# Patient Record
Sex: Male | Born: 1990 | Race: White | Hispanic: No | Marital: Single | Smoking: Never smoker
Health system: Southern US, Community
[De-identification: ages and names within clinical notes are randomized; demographics above are authoritative.]

## PROBLEM LIST (undated history)

## (undated) DIAGNOSIS — D369 Benign neoplasm, unspecified site: Secondary | ICD-10-CM

## (undated) HISTORY — PX: TUMOR REMOVAL: SHX12

## (undated) HISTORY — DX: Benign neoplasm, unspecified site: D36.9

---
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
## 2011-12-27 ENCOUNTER — Inpatient Hospital Stay: Payer: Self-pay | Admitting: Internal Medicine

## 2011-12-27 LAB — CBC
MCH: 30.8 pg (ref 26.0–34.0)
MCHC: 34.8 g/dL (ref 32.0–36.0)
MCV: 89 fL (ref 80–100)
Platelet: 127 10*3/uL — ABNORMAL LOW (ref 150–440)
RDW: 13.9 % (ref 11.5–14.5)

## 2011-12-27 LAB — TROPONIN I
Troponin-I: <0.02 ng/mL
Troponin-I: 0.02 ng/mL

## 2011-12-27 LAB — URINALYSIS, COMPLETE
Bilirubin,UR: NEGATIVE
Glucose,UR: NEGATIVE mg/dL (ref 0–75)
Leukocyte Esterase: NEGATIVE
Ph: 6 (ref 4.5–8.0)
RBC,UR: 11 /HPF (ref 0–5)
Specific Gravity: 1.027 (ref 1.003–1.030)
Squamous Epithelial: 4
WBC UR: 7 /HPF (ref 0–5)

## 2011-12-27 LAB — DIFFERENTIAL
Bands: 7 %
Basophil #: 0 10*3/uL (ref 0.0–0.1)
Basophil %: 0.9 %
Lymphocyte #: 0.5 10*3/uL — ABNORMAL LOW (ref 1.0–3.6)
Lymphocyte %: 16.5 %
Lymphocytes: 11 %
Monocyte %: 4 %
Monocytes: 3 %
Neutrophil #: 2.4 10*3/uL (ref 1.4–6.5)
Variant Lymphocyte - H1-Rlymph: 5 %

## 2011-12-27 LAB — LIPID PANEL
HDL Cholesterol: 26 mg/dL — ABNORMAL LOW (ref 40–60)
Ldl Cholesterol, Calc: 23 mg/dL (ref 0–100)
Triglycerides: 158 mg/dL (ref 0–200)

## 2011-12-27 LAB — COMPREHENSIVE METABOLIC PANEL
Albumin: 3.4 g/dL (ref 3.4–5.0)
Alkaline Phosphatase: 69 U/L (ref 50–136)
BUN: 13 mg/dL (ref 7–18)
Bilirubin,Total: 0.6 mg/dL (ref 0.2–1.0)
Calcium, Total: 8.4 mg/dL — ABNORMAL LOW (ref 8.5–10.1)
Glucose: 103 mg/dL — ABNORMAL HIGH (ref 65–99)
Potassium: 4.1 mmol/L (ref 3.5–5.1)
SGOT(AST): 284 U/L — ABNORMAL HIGH (ref 15–37)
SGPT (ALT): 202 U/L — ABNORMAL HIGH (ref 12–78)
Sodium: 135 mmol/L — ABNORMAL LOW (ref 136–145)
Total Protein: 8 g/dL (ref 6.4–8.2)

## 2011-12-27 LAB — RAPID INFLUENZA A&B ANTIGENS

## 2011-12-27 LAB — RAPID HIV-1/2 QL/CONFIRM: HIV-1/2,Rapid Ql: NEGATIVE

## 2011-12-27 LAB — LIPASE, BLOOD: Lipase: 140 U/L (ref 73–393)

## 2011-12-28 LAB — COMPREHENSIVE METABOLIC PANEL
Albumin: 2.8 g/dL — ABNORMAL LOW (ref 3.4–5.0)
Creatinine: 0.8 mg/dL (ref 0.60–1.30)
EGFR (Non-African Amer.): >60
Glucose: 105 mg/dL — ABNORMAL HIGH (ref 65–99)
Potassium: 3.5 mmol/L (ref 3.5–5.1)
SGOT(AST): 237 U/L — ABNORMAL HIGH (ref 15–37)
SGPT (ALT): 166 U/L — ABNORMAL HIGH (ref 12–78)
Total Protein: 6.6 g/dL (ref 6.4–8.2)

## 2011-12-28 LAB — CBC WITH DIFFERENTIAL/PLATELET
Basophil #: 0 10*3/uL (ref 0.0–0.1)
Eosinophil #: 0 10*3/uL (ref 0.0–0.7)
Eosinophil %: 0 %
Lymphocyte #: 0.8 10*3/uL — ABNORMAL LOW (ref 1.0–3.6)
Lymphocyte %: 25.1 %
MCH: 30.7 pg (ref 26.0–34.0)
MCHC: 34.9 g/dL (ref 32.0–36.0)
Monocyte #: 0.3 x10 3/mm (ref 0.2–1.0)
Neutrophil %: 65.9 %
RDW: 13.7 % (ref 11.5–14.5)

## 2011-12-28 LAB — TROPONIN I: Troponin-I: <0.02 ng/mL

## 2011-12-28 LAB — LIPID PANEL
HDL Cholesterol: 24 mg/dL — ABNORMAL LOW (ref 40–60)
Ldl Cholesterol, Calc: 20 mg/dL (ref 0–100)
Triglycerides: 140 mg/dL (ref 0–200)

## 2011-12-29 LAB — CBC WITH DIFFERENTIAL/PLATELET
Basophil %: 0.7 %
Eosinophil #: 0 10*3/uL (ref 0.0–0.7)
Eosinophil %: 0 %
Lymphocyte #: 1.4 10*3/uL (ref 1.0–3.6)
Lymphocyte %: 37 %
MCH: 30.4 pg (ref 26.0–34.0)
MCHC: 34.6 g/dL (ref 32.0–36.0)
Monocyte #: 0.4 x10 3/mm (ref 0.2–1.0)
Neutrophil #: 1.9 10*3/uL (ref 1.4–6.5)
Neutrophil %: 51.4 %
RBC: 4.79 10*6/uL (ref 4.40–5.90)
RDW: 14.1 % (ref 11.5–14.5)

## 2011-12-29 LAB — COMPREHENSIVE METABOLIC PANEL
Albumin: 2.8 g/dL — ABNORMAL LOW (ref 3.4–5.0)
Alkaline Phosphatase: 58 U/L (ref 50–136)
Anion Gap: 7 (ref 7–16)
BUN: 6 mg/dL — ABNORMAL LOW (ref 7–18)
Bilirubin,Total: 0.4 mg/dL (ref 0.2–1.0)
Creatinine: 0.65 mg/dL (ref 0.60–1.30)
Glucose: 95 mg/dL (ref 65–99)
Osmolality: 275 (ref 275–301)
Potassium: 3.5 mmol/L (ref 3.5–5.1)
SGOT(AST): 167 U/L — ABNORMAL HIGH (ref 15–37)
Sodium: 139 mmol/L (ref 136–145)
Total Protein: 6.8 g/dL (ref 6.4–8.2)

## 2011-12-31 DIAGNOSIS — I517 Cardiomegaly: Secondary | ICD-10-CM

## 2011-12-31 LAB — VANCOMYCIN, TROUGH
Vancomycin, Trough: >50 ug/mL (ref 10–20)
Vancomycin, Trough: 44 ug/mL (ref 10–20)

## 2012-01-01 LAB — CBC WITH DIFFERENTIAL/PLATELET
Basophil #: 0 10*3/uL (ref 0.0–0.1)
Basophil %: 0.2 %
Eosinophil #: 0.1 10*3/uL (ref 0.0–0.7)
Eosinophil %: 0.8 %
HCT: 41.3 % (ref 40.0–52.0)
Lymphocyte %: 14 %
MCH: 30.6 pg (ref 26.0–34.0)
MCHC: 35.3 g/dL (ref 32.0–36.0)
MCV: 87 fL (ref 80–100)
Monocyte #: 0.7 x10 3/mm (ref 0.2–1.0)
Neutrophil #: 5.4 10*3/uL (ref 1.4–6.5)
Neutrophil %: 75.1 %
Platelet: 185 10*3/uL (ref 150–440)
RBC: 4.76 10*6/uL (ref 4.40–5.90)
RDW: 13.7 % (ref 11.5–14.5)
WBC: 7.2 10*3/uL (ref 3.8–10.6)

## 2012-01-01 LAB — COMPREHENSIVE METABOLIC PANEL
Albumin: 2.8 g/dL — ABNORMAL LOW (ref 3.4–5.0)
Alkaline Phosphatase: 92 U/L (ref 50–136)
Anion Gap: 7 (ref 7–16)
BUN: 12 mg/dL (ref 7–18)
Chloride: 108 mmol/L — ABNORMAL HIGH (ref 98–107)
Co2: 27 mmol/L (ref 21–32)
Creatinine: 2.75 mg/dL — ABNORMAL HIGH (ref 0.60–1.30)
EGFR (Non-African Amer.): 31 — ABNORMAL LOW
Glucose: 111 mg/dL — ABNORMAL HIGH (ref 65–99)
Osmolality: 284 (ref 275–301)
SGPT (ALT): 53 U/L (ref 12–78)
Sodium: 142 mmol/L (ref 136–145)

## 2012-01-01 LAB — CULTURE, BLOOD (SINGLE)

## 2012-12-24 DIAGNOSIS — S52509A Unspecified fracture of the lower end of unspecified radius, initial encounter for closed fracture: Secondary | ICD-10-CM | POA: Insufficient documentation

## 2014-01-08 IMAGING — CR DG CHEST 2V
1 series · 3 of 3 positions shown · non-contrast
Comparison: none

REASON FOR EXAM: FEVER, COUGH
COMMENTS:

[Series 1: pa · 0.17mm/px · 3 of 3 slices shown]
[im 1/3]
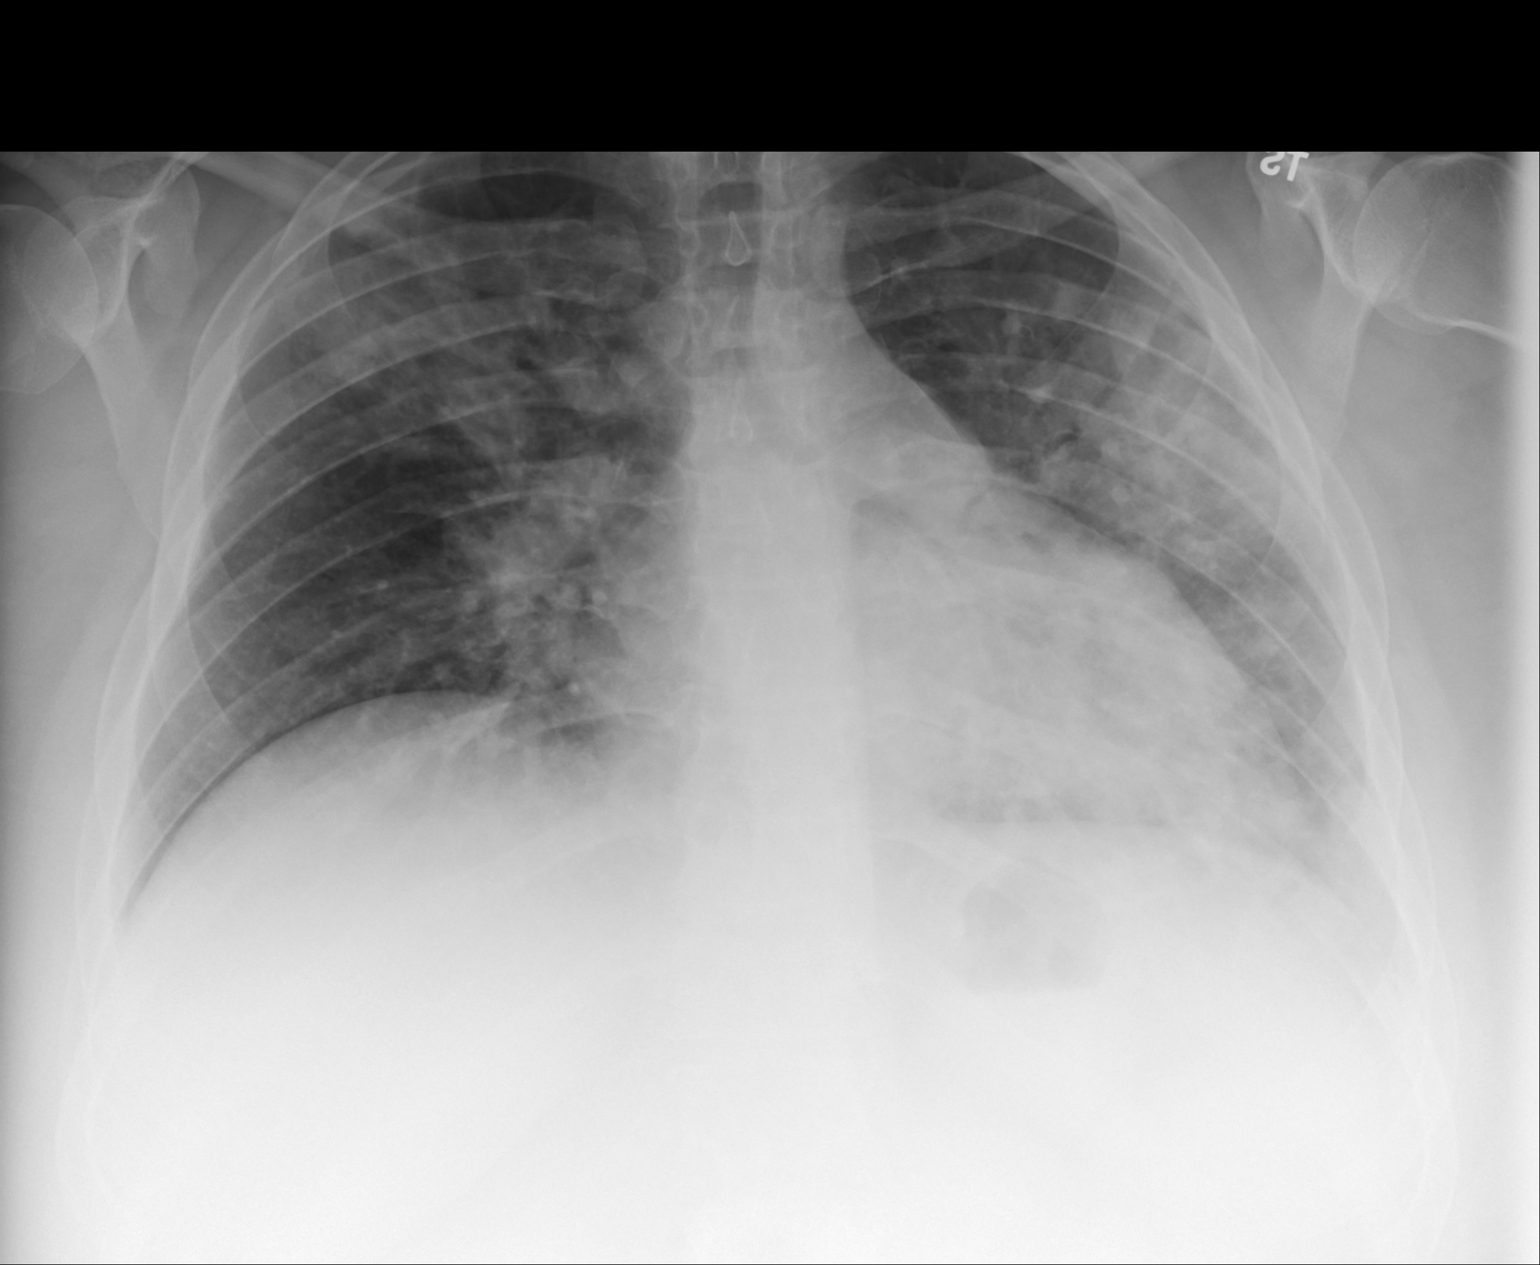
[im 2/3]
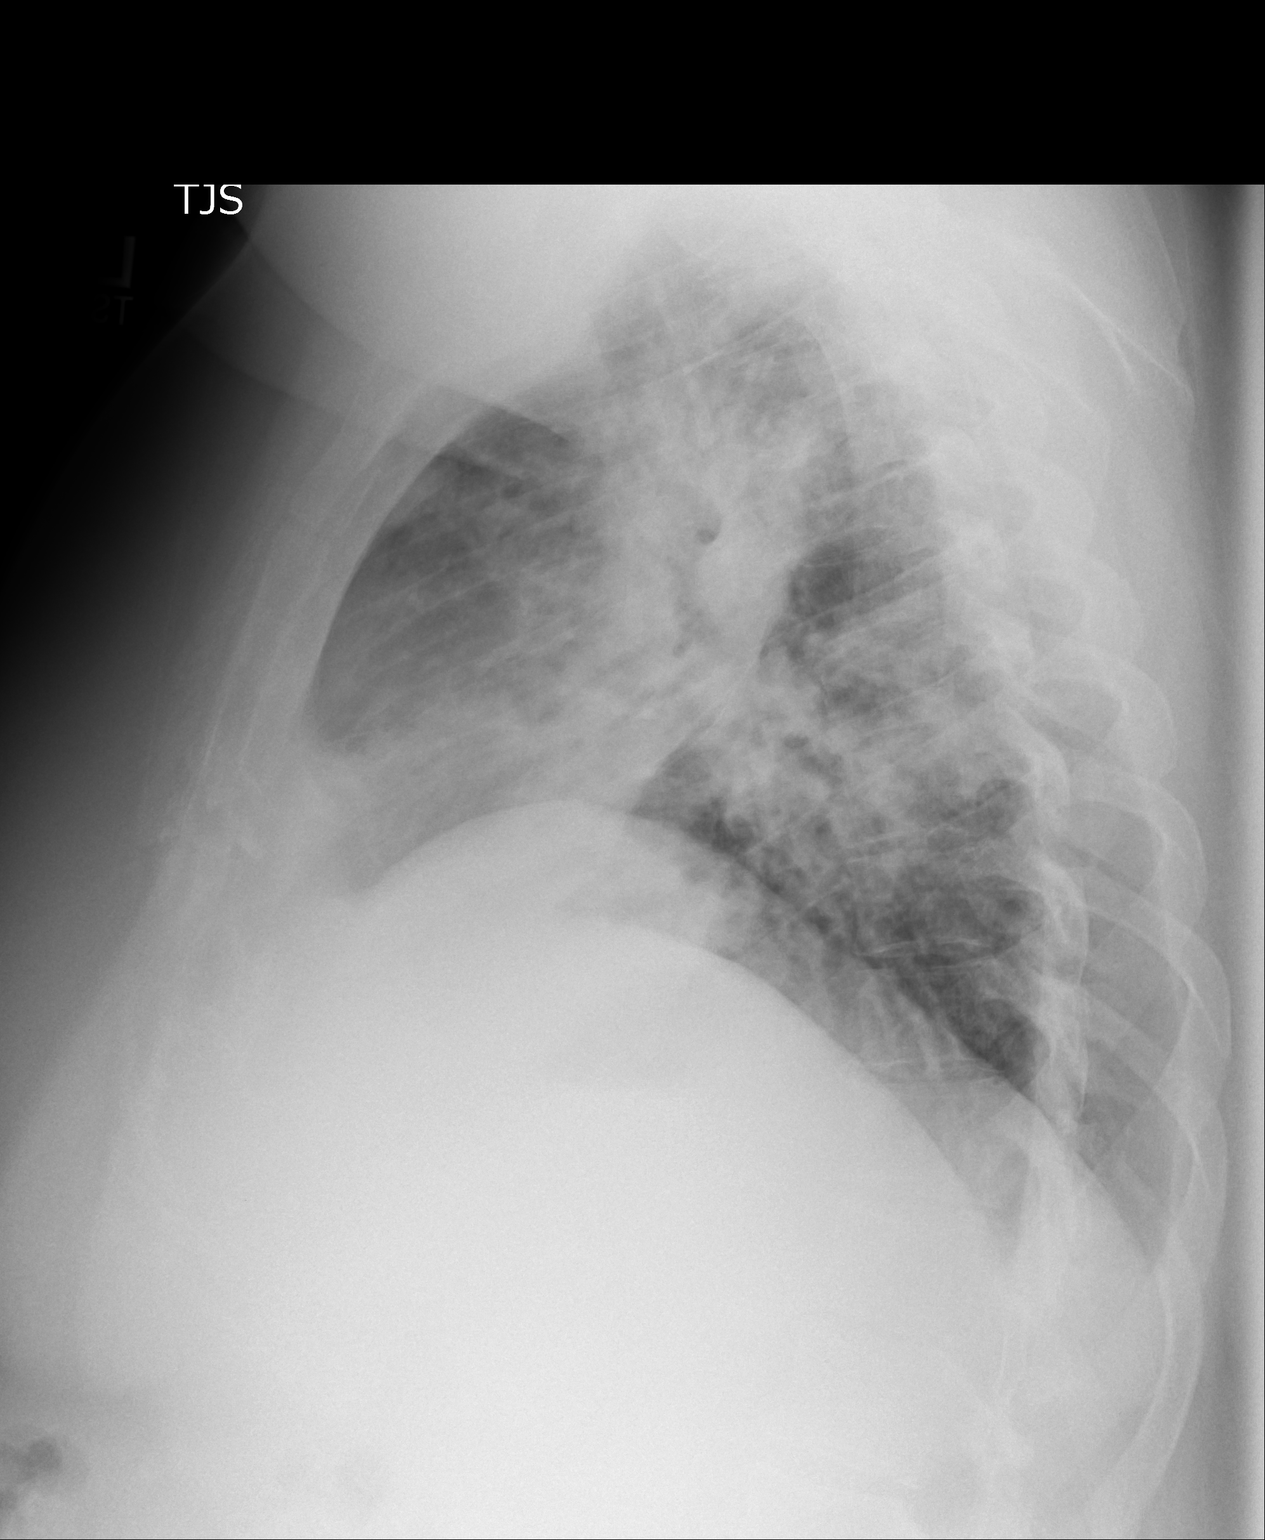
[im 3/3]
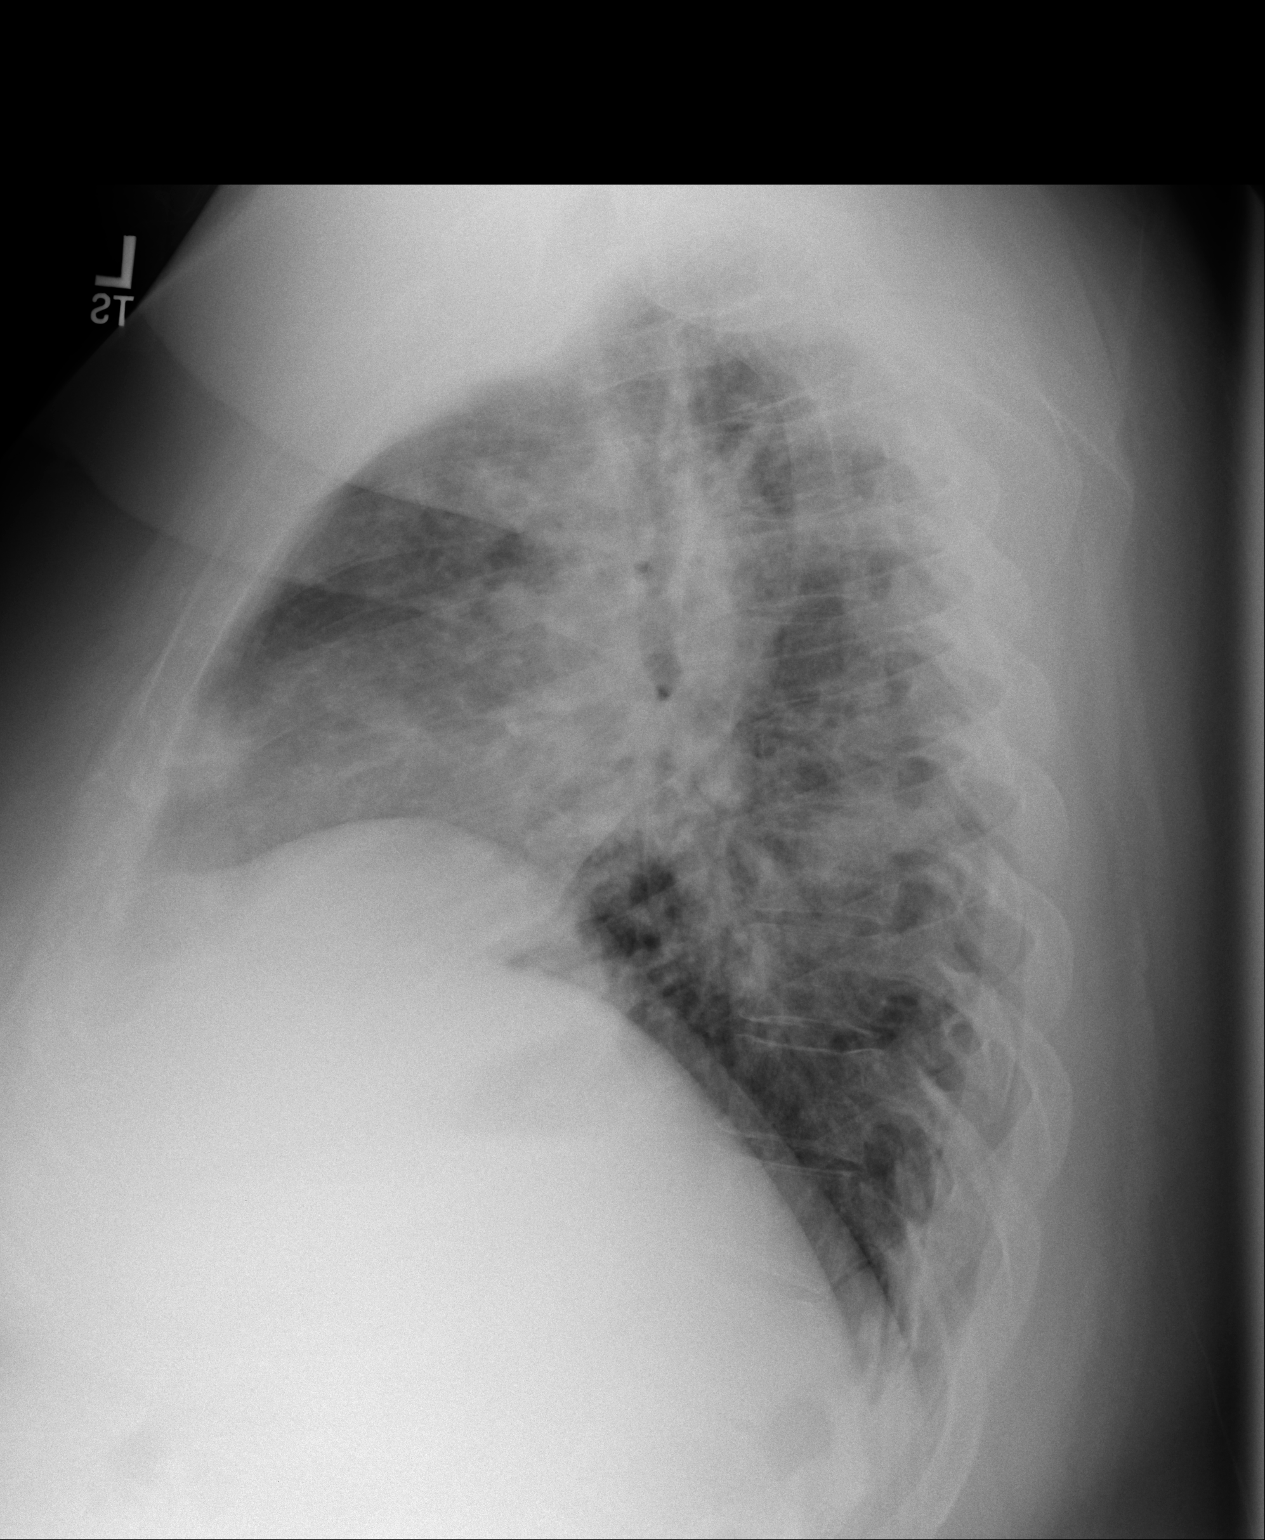

[3 of 3 positions shown; findings below may reference images not displayed]

PROCEDURE:     DXR - DXR CHEST PA (OR AP) AND LATERAL  - December 27, 2011 [DATE]

RESULT:     There is hypoinflation. The cardiac silhouette is enlarged.
Patchy densities are present in the right upper lobe and left lower lobe
consistent with bilateral pneumonia. There may be some right middle lobe
involvement as well in the medial segment. There is no effusion or
pneumothorax. The heart is borderline enlarged.
IMPRESSION: Multifocal bilateral pneumonia. Correlate with clinical and
laboratory data. Followup to document complete clearing is recommended.

[REDACTED]

## 2014-01-10 IMAGING — CT CT CHEST W/ CM
1 series · 15 of 33 positions shown, 19 images · non-contrast
Comparison: none

REASON FOR EXAM: Bilateral pneumonia, hypoxia
COMMENTS:

[Series 2: soft tissue · axial · 0.96mm/px · z∈[-686,-410]mm · 15 of 108 slices shown, 19 images]
[im 8/108  mediastinal]
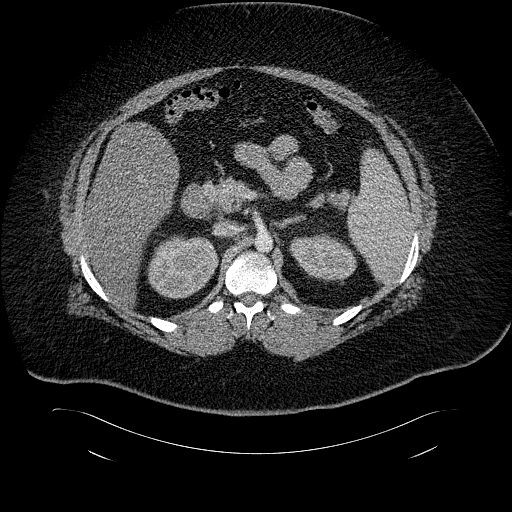
[im 8/108  lung]
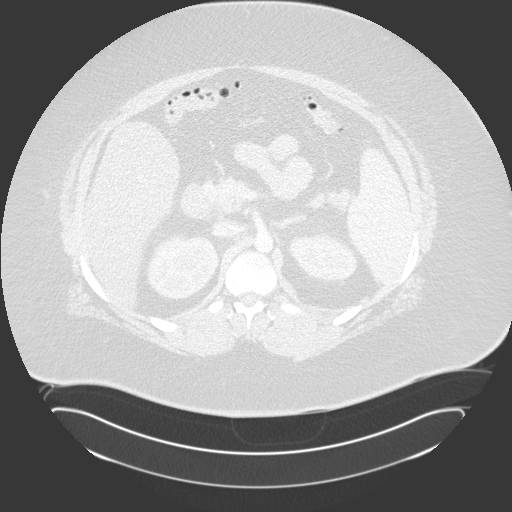
[im 16/108  lung]
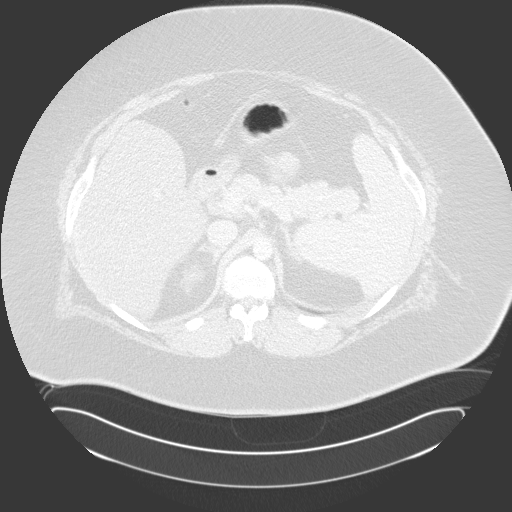
[im 22/108  lung]
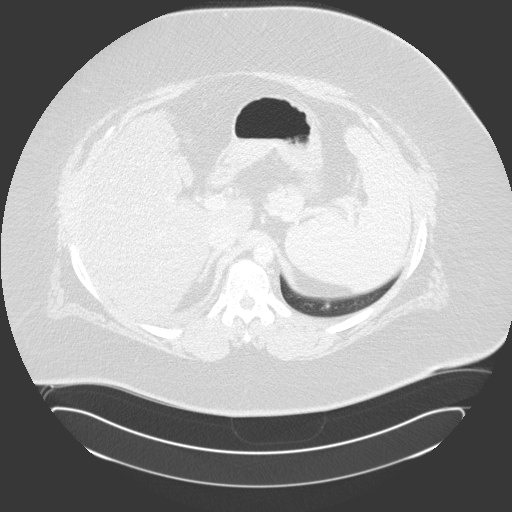
[im 28/108  lung]
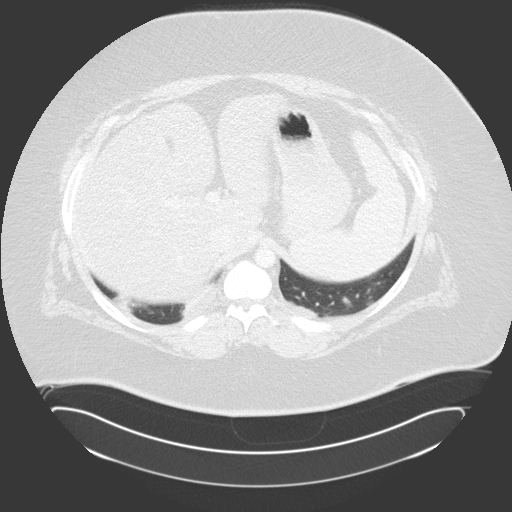
[im 36/108  mediastinal]
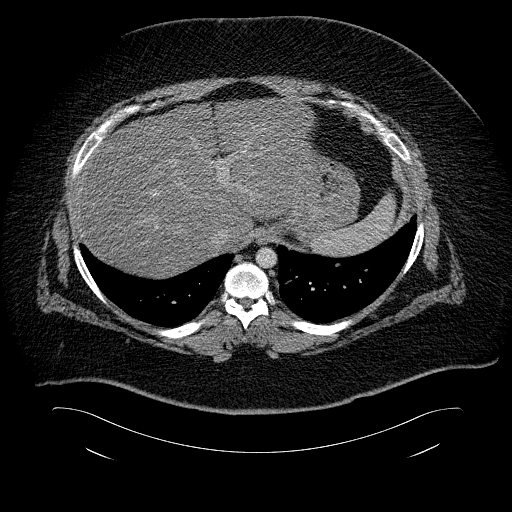
[im 36/108  lung]
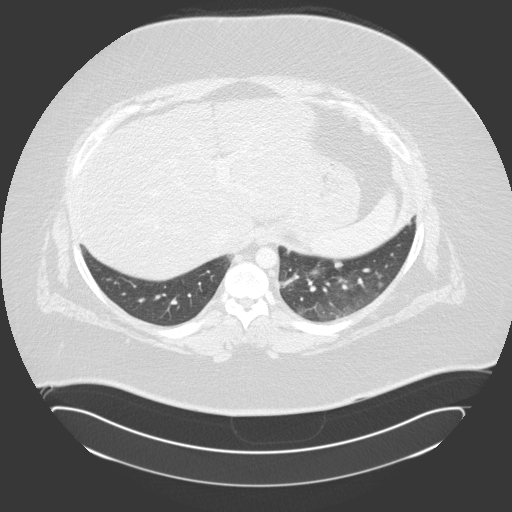
[im 43/108  lung]
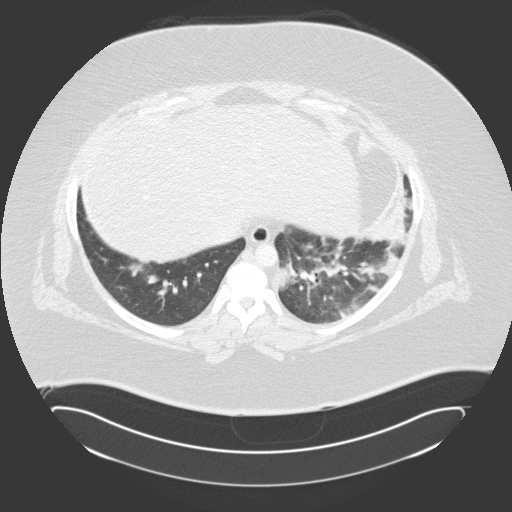
[im 48/108  lung]
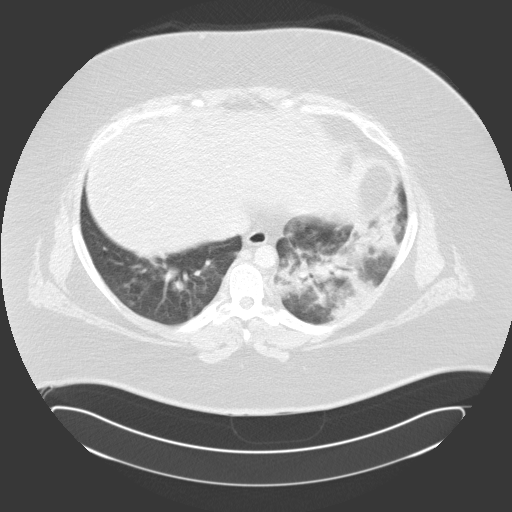
[im 56/108  lung]
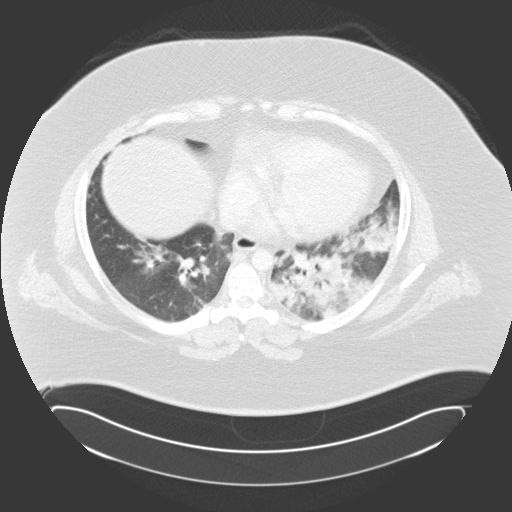
[im 60/108  mediastinal]
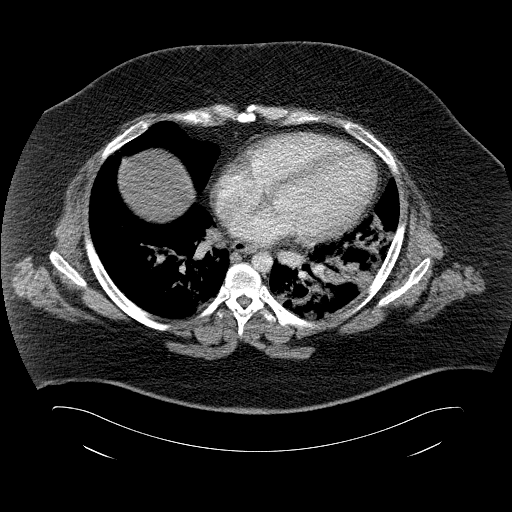
[im 60/108  lung]
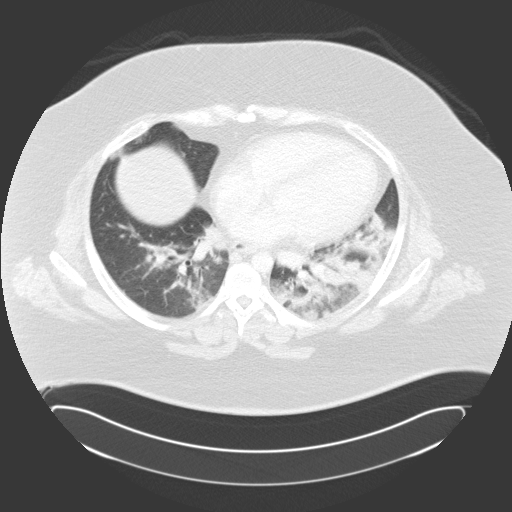
[im 65/108  lung]
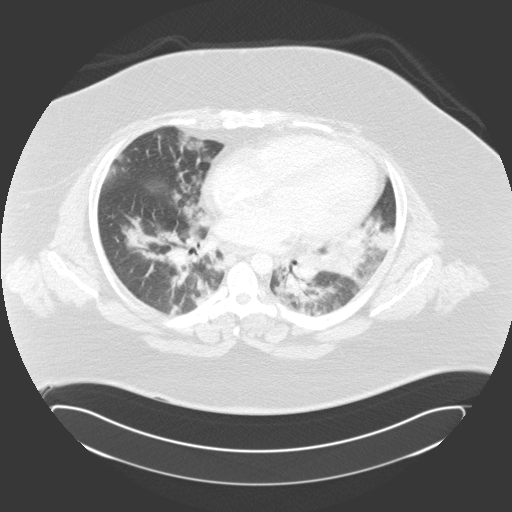
[im 72/108  lung]
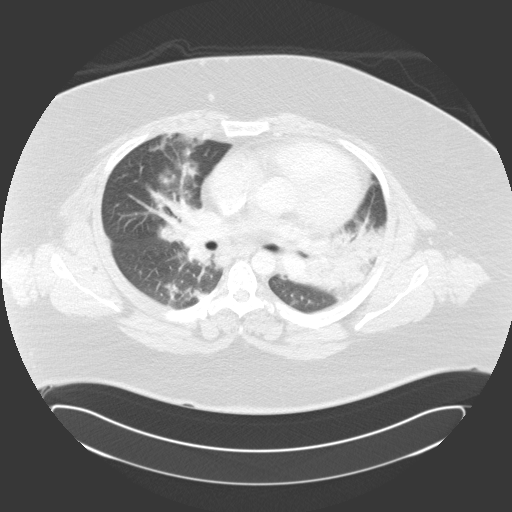
[im 80/108  lung]
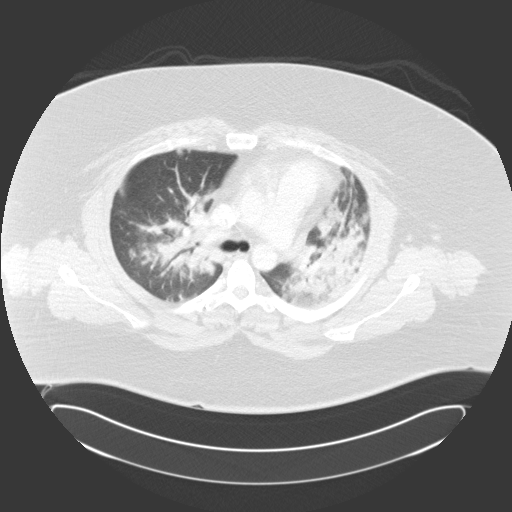
[im 86/108  mediastinal]
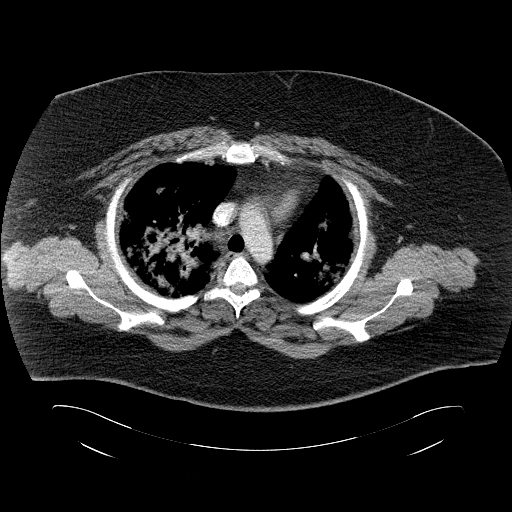
[im 86/108  lung]
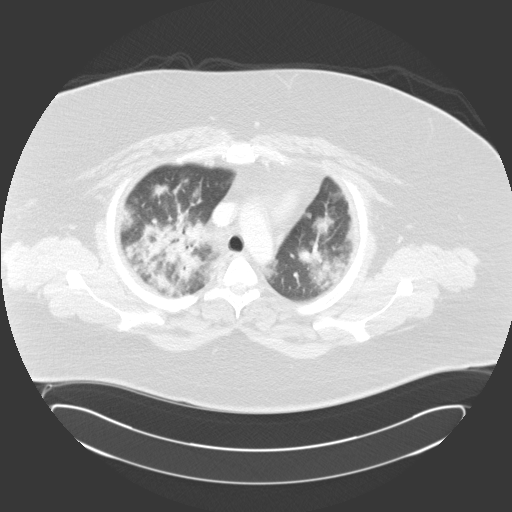
[im 92/108  lung]
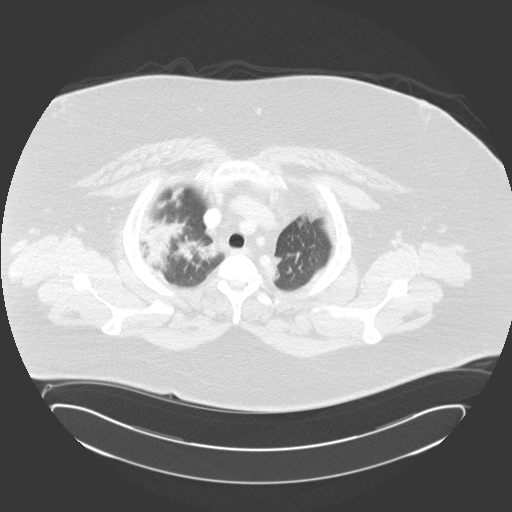
[im 100/108  lung]
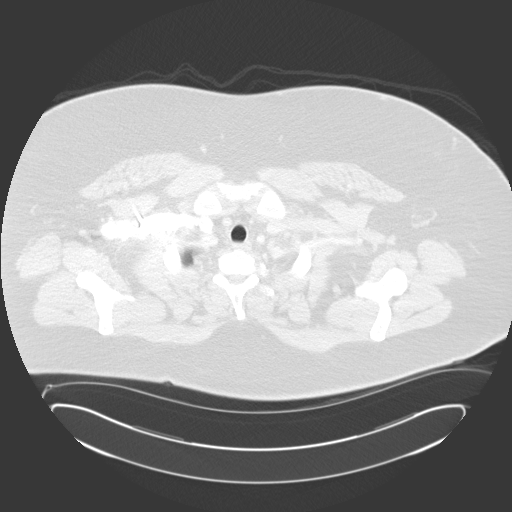

[15 of 33 positions shown; findings below may reference images not displayed]

PROCEDURE:     CT  - CT CHEST WITH CONTRAST  - December 29, 2011  [DATE]

RESULT:     Axial CT scanning was performed through the chest with
reconstructions at 3 mm intervals and slice thicknesses following
intravenous administration of 100 cc of Isovue-III. Review of multiplanar
reconstructed images was performed separately on the VIA monitor.

The contrast bolus is poor. The cardiac chambers are mildly enlarged. There
are patchy alveolar infiltrates in both lungs but most especially on the
left consistent with pneumonia. There is no pleural effusion. No bulky
mediastinal or hilar lymph nodes are demonstrated.

Within the upper abdomen the observed portions of the liver exhibit
decreased density consistent with fatty infiltration. The spleen is enlarged
but this may be normal for the patient's body habitus. The maximal measured
AP dimension is 15.9 cm.
IMPRESSION: 1. There are widespread alveolar infiltrates within both lungs consistent
with pneumonia.
2. I do not see mediastinal or hilar lymphadenopathy nor evidence of pleural
nor pericardial effusions.
3. The cardiac chambers are mildly enlarged. The timing of the contrast
bolus was not ideal for evaluation of the pulmonary arterial tree. No right
or left main pulmonary artery filling defects are demonstrated.
4. There are fatty infiltrative changes of the liver.

[REDACTED]

## 2014-01-11 IMAGING — US ABDOMEN ULTRASOUND LIMITED
1 series · 14 of 25 positions shown · non-contrast
Comparison: none

REASON FOR EXAM: vomiting, elevetated liver enzymes
COMMENTS:   Body Site: GB and Fossa, CBD, Head of Pancreas

[Series 1: abdomen ultrasound limited · 0.35mm/px · 14 of 51 slices shown]
[im 1/51]
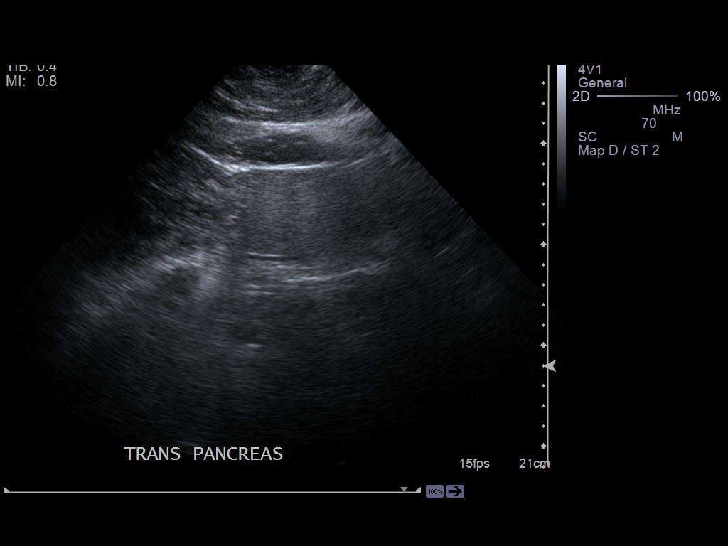
[im 5/51]
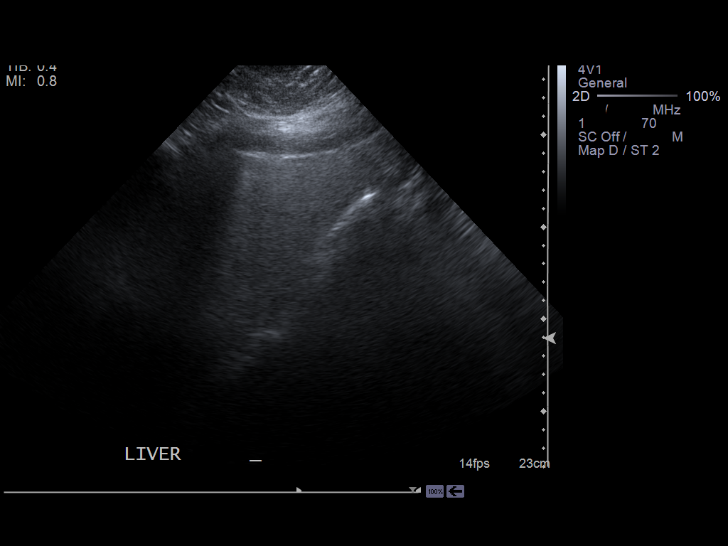
[im 9/51]
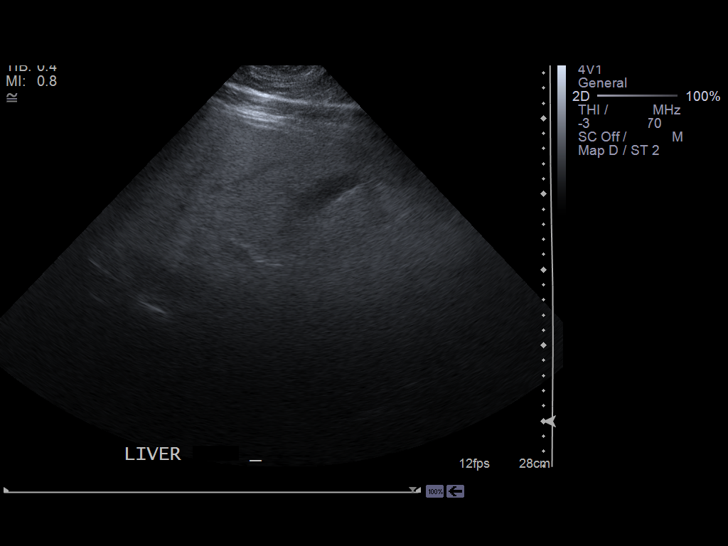
[im 13/51]
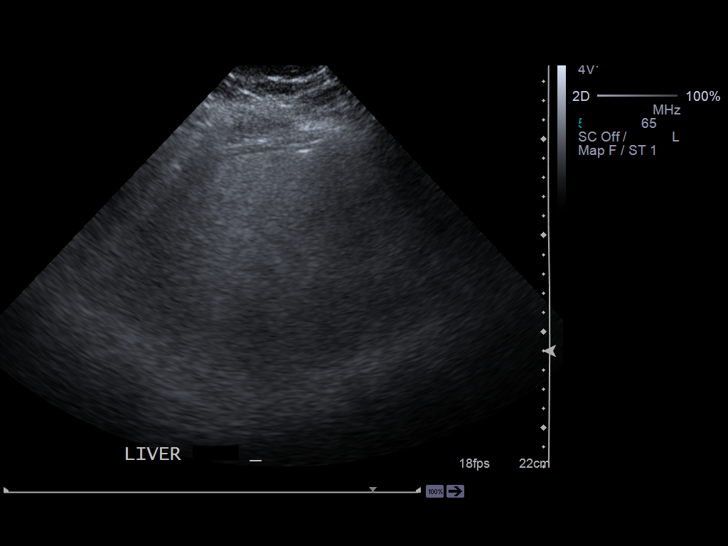
[im 17/51]
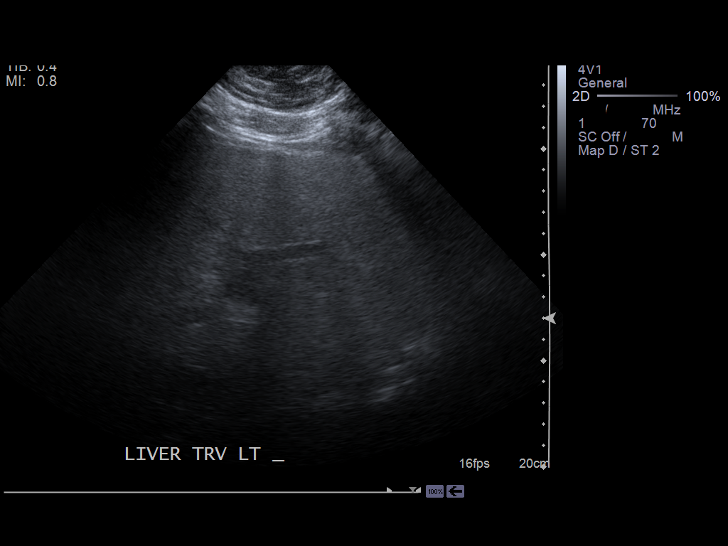
[im 19/51]
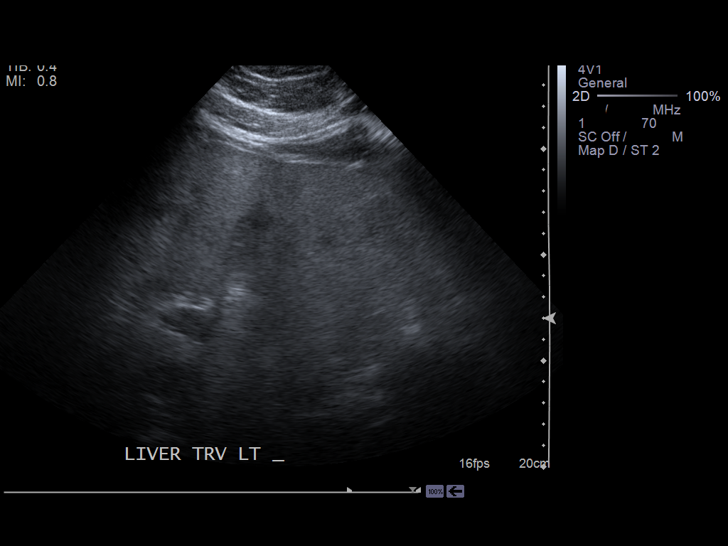
[im 23/51]
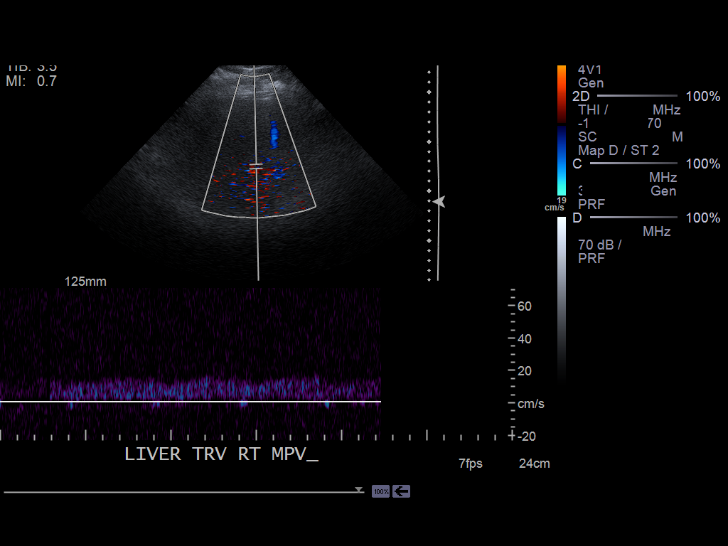
[im 28/51]
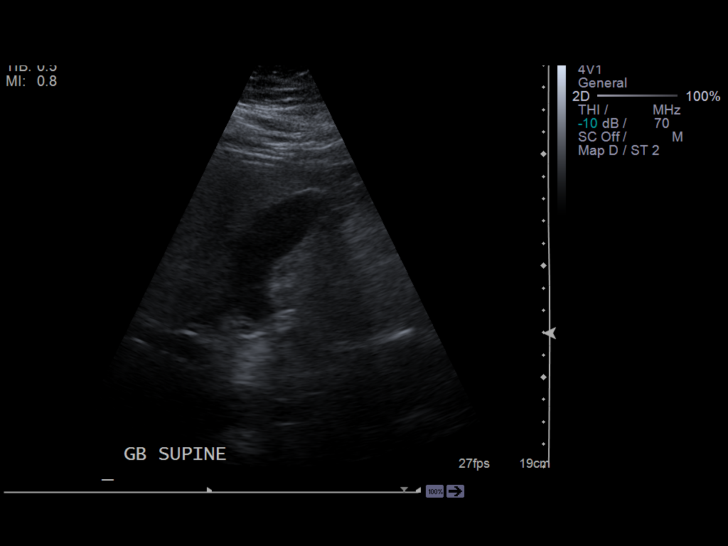
[im 32/51]
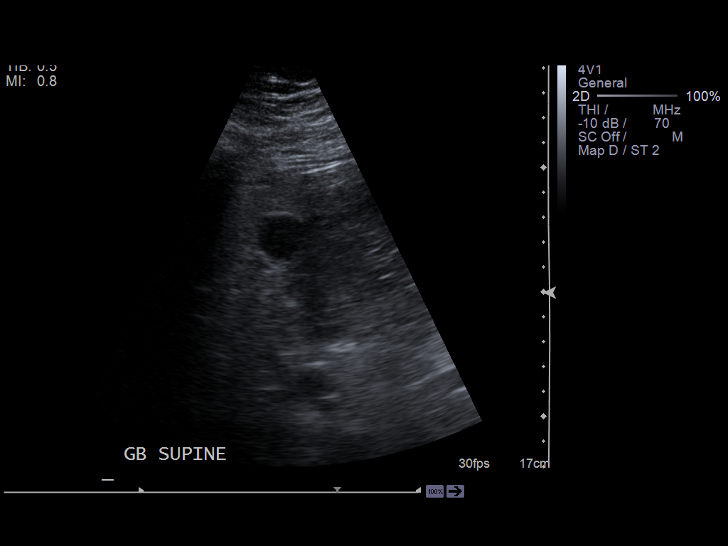
[im 34/51]
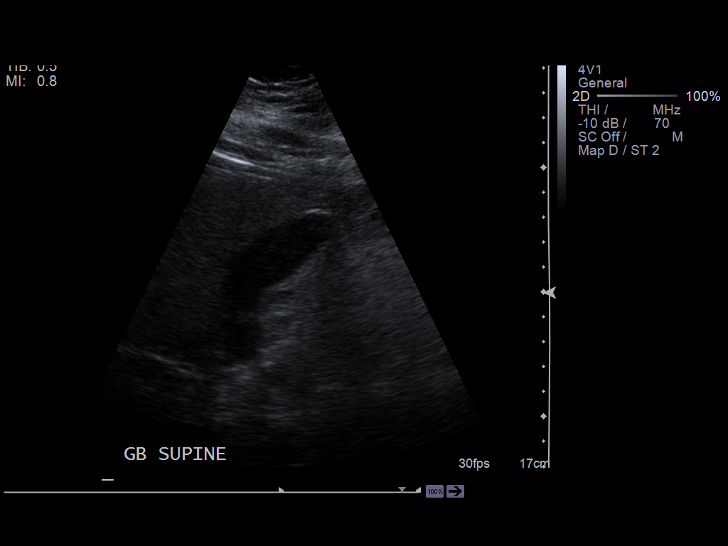
[im 38/51]
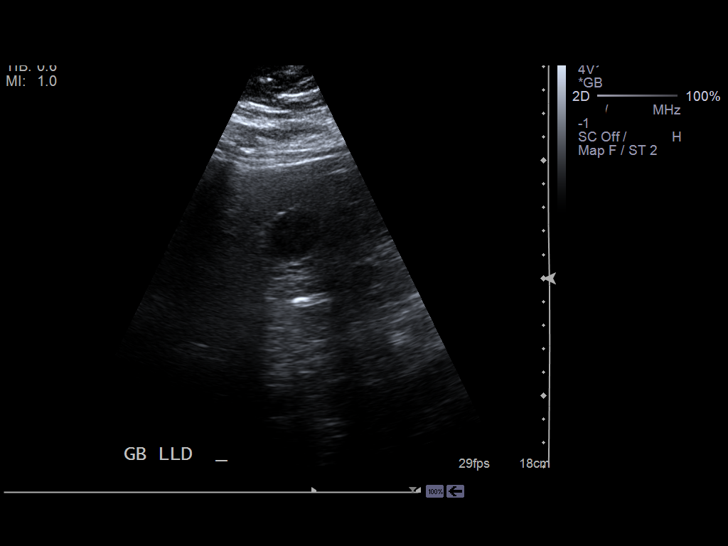
[im 42/51]
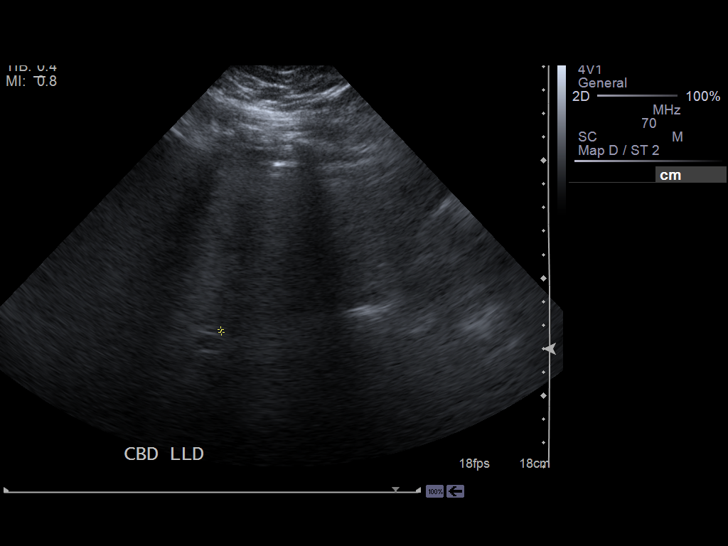
[im 46/51]
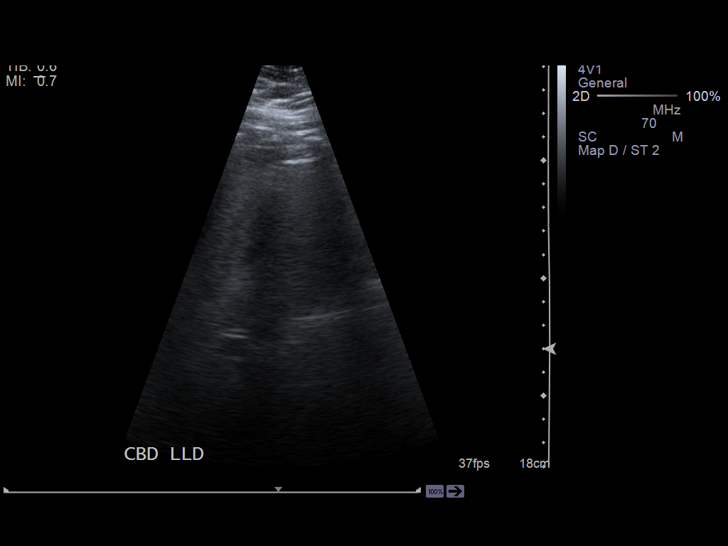
[im 51/51]
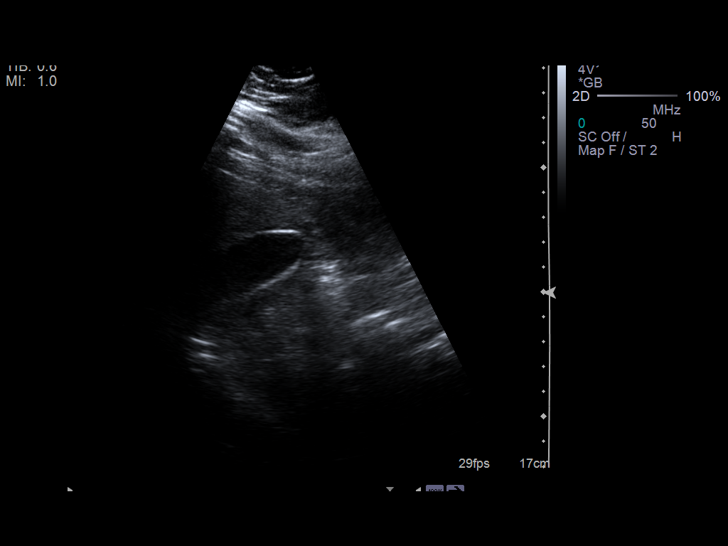

[14 of 25 positions shown; findings below may reference images not displayed]

PROCEDURE:     US  - US ABDOMEN LIMITED SURVEY  - December 30, 2011  [DATE]

RESULT:     Limited abdominal sonogram is performed of the right upper
quadrant. The pancreas could not be visualized. The hepatic echotexture is
dense. Portal venous flow is normal area gallbladder wall is 2.6 mm thick.
There is no pericholecystic fluid or evidence of cholelithiasis. There is no
finding of acute cholecystitis. The common bile duct diameter is 1.2 mm.
IMPRESSION: Fatty infiltration of the liver. No evidence of
cholelithiasis or acute cholecystitis. Nonvisualization of the pancreas.

[REDACTED]

## 2014-03-13 ENCOUNTER — Emergency Department: Payer: Self-pay | Admitting: Emergency Medicine

## 2014-05-02 NOTE — H&P (Signed)
PATIENT NAME:  Rodney Collins, Rodney Collins MR#:  [id number] DATE OF BIRTH:  1990/09/24  DATE OF ADMISSION:  12/27/2011  PRIMARY CARE PHYSICIAN: Morton Peters., MD   HISTORY OF PRESENT ILLNESS: The patient is a 24 year old Caucasian male with no significant past medical history who presented to the hospital with complaints of nausea and vomiting. According to the patient, he has been nauseated and vomiting since Monday, which is approximately 5 days ago. He vomited today approximately twice. He also is complaining of high fevers and having high chills. He has been sick since Monday, has been having body aches and feeling feverish and chilled and feeling very poorly. On Wednesday he felt somewhat a little bit better; however, on Thursday, which is two days ago, he got really much worse and he went to Urgent Care. At Urgent Care, his fevers were as high as 101.6. He was given a Phenergan shot, and he was given some Zofran but no labs were taken, and he was discharged home. This morning he was seen in the Emergency Room and was noted to have abnormal lab studies with elevated LFTs. He was also noted to be dehydrated, and hospitalist services were contacted for admission. Chest x-ray also was concerning for pneumonia. The patient is complaining of cough and whitish sputum. 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  They were given Tamiflu; however, the patient himself was not taking any Tamiflu, however, his symptoms are not improving.   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   MEDICATIONS: Ibuprofen as well as Zofran as needed.   ALLERGIES: None.   FAMILY HISTORY: Negative for  coronary artery disease, hypertension, CVA, diabetes. The patient's maternal grandfather had lung cancer. He was also a smoker.   SOCIAL HISTORY: The patient is single. He does not smoke. He drinks alcohol very rarely occasionally, 1 glass maybe in 6 months or so. He works in a Therapist, art.  REVIEW OF SYSTEMS: Positive for feeling feverish and chills, also fatigued and weak for the past five days, pains all over his body, discomfort also in the abdomen, feeling that the abdomen is rumbling. He has been coughing with whitish foamy phlegm, also short of breath. He has been having also chest pains which he feels are due to indigestion, possibly acid reflux disease, nausea and vomiting. He has been also having intermittent diarrhea. His last stool was on Thursday, which is two days ago. Now his diarrhea has stopped. He has been having also symptoms of dysuria with burning sensation as well as pain whenever he urinates; however, he denies any hematuria or other symptoms. He also feels somewhat confused and disoriented intermittently. CONSTITUTIONAL: Otherwise, he denies any weight loss or gain. EYES: In regards to eyes, he denies any blurry vision, double vision, glaucoma, or cataracts. ENT: Denies any tinnitus, allergies, epistaxis, sinus pain, dentures, difficulty swallowing. RESPIRATORY: Denies any wheezes, asthma, chronic obstructive pulmonary disease. CARDIOVASCULAR: Denies any orthopnea, edema, arrhythmias, palpitations or syncope. GASTROINTESTINAL: Denies any hematemesis, rectal bleeding, change in bowel habits. GENITOURINARY: Admits of dysuria. Denies hematuria, frequency, or incontinence. ENDOCRINOLOGY: Denies any polydipsia, nocturia, thyroid problems, heat or cold intolerance or thirst. HEMATOLOGICAL: Denies any  anemia, easy bruising, bleeding, or swollen glands. SKIN: Denies any acne, rashes, lesions, or change in moles. MUSCULOSKELETAL: Denies arthritis, cramps, swelling, or gout. NEUROLOGIC: Denies  numbness, epilepsy, or tremor. PSYCHIATRIC: Denies anxiety, insomnia, or depression.    PHYSICAL EXAMINATION:  VITAL SIGNS: On arrival to the hospital, temperature is 98.3, pulse was 85, respiration rate is 22, blood pressure 150/81, saturation 92% on room air.   GENERAL: This is a well-nourished obese Caucasian male in mild discomfort due to his weakness as well as achiness of all his body.   HEENT: Her pupils are equal and reactive to light. Extraocular movements are intact. No icterus or conjunctivitis.  Has normal hearing.  No pharyngeal erythema. Mucosa is dry and with whitish coating on the tongue.  Marland Kitchen  NECK: No masses, supple, nontender. Thyroid is not enlarged. No adenopathy. No JVD or carotid bruits bilaterally.  Full range of motion.   LUNGS: Clear to auscultation in all fields. A few rales as well as rhonchi were heard bilaterally. No significantly diminished breath sounds or wheezing. No labored inspirations, increased effort, dullness to percussion or overt respiratory distress, but the patient does have some tachypnea.   CARDIOVASCULAR: S1, S2 appreciated. No murmurs, gallops, or rubs noted. PMI is not enlarged. Chest is tender to palpation, 1+ pedal pulses, no lower extremity edema, calf tenderness or cyanosis was noted.   ABDOMEN: Soft, protuberant and tender diffusely.  No hepatosplenomegaly or masses were noted.   RECTAL: Deferred.   MUSCULOSKELETAL: Muscle strength: Able to move extremities. No cyanosis, degenerative joint disease or kyphosis. Gait is not tested.   SKIN: Did not reveal any rashes, lesions, erythema, nodularity, or induration.  It was warm and dry to palpation. No adenopathy in the cervical region.    NEUROLOGICAL:  Cranial nerves are grossly intact. Sensory is intact.  No dysarthria or aphasia.  The patient is alert and oriented to time, person and place, cooperative. Memory is good.   PSYCHIATRIC: No significant confusion, agitation or depression noted.    LABORATORY, DIAGNOSTIC, AND RADIOLOGICAL DATA: BMP showed glucose of 103, sodium 135, potassium level of 8.4, otherwise BMP was unremarkable. Lipase level is normal at 140.0. The patient's liver enzymes showed elevation of AST as well as ALT to 284 and 202, respectively. The patient's white blood cell count is low at 3.0, hemoglobin was 16.7, platelet count 127, absolute neutrophil count is 2.4. He has leukopenia with lymphocyte count low at 0.5. HIV test by [facility] is negative.   Radiologic studies: Chest x-ray, PA and lateral, on 12/27/2011 showed multifocal bilateral pneumonia. Correlate with clinical and laboratory data. Follow up to document complete clearing is recommended, according to the radiologist.   ASSESSMENT/PLAN:  1. Systemic inflammatory response reaction: Admit the patient to the medical floor. Get blood cultures. Start the patient on antibiotic therapy for pneumonia.  2. Bilateral pneumonia: Get sputum cultures, start Rocephin as well as Zithromax. HIV negative.  3. Nausea and vomiting: Supportive therapy at this time,  IV fluids as well as clear liquid diet.  4. Hepatitis of unclear etiology: Awaiting the patient's hepatitis panel and will make decisions about other testing, if needed.  5. Leukopenia: Unclear etiology, likely due to virus. Following in the morning.  6. Obesity: Check TSH as well as lipid panel.   TIME SPENT: 50 minutes.  ____________________________ Theodoro Grist, MD rv:cbb D: 12/27/2011 14:55:22 ET T: 12/27/2011 15:31:43 ET JOB#: [id number]  cc: Theodoro Grist, MD, <Dictator> Morton Peters., MD Raife Lizer  Sharmain Lastra MD ELECTRONICALLY SIGNED 12/27/2011 16:15

## 2014-05-05 NOTE — Discharge Summary (Signed)
PATIENT NAME:  Rodney Collins, Rodney Collins MR#:  [id number] DATE OF BIRTH:  20-Sep-1990  DATE OF ADMISSION:  12/27/2011 DATE OF DISCHARGE:  01/01/2012  PRIMARY CARE PHYSICIAN: None.  DISCHARGE DIAGNOSES: Pneumonia, obesity and acute renal failure.   HISTORY OF PRESENT ILLNESS: A 24 year old Caucasian male with no significant past medical history, who presented to the hospital complaining of nausea and vomiting. According to the patient, he had been nauseated and vomited since last 3 to 4 days. Subsequently, he vomited twice on the day of presentation to the hospital. Also complaining of high-grade fever and having chills. He has been sick for the last 5 days, having body aches, feeling feverish and chills. After 2 days of starting these symptoms, he felt somewhat better, but on the next day he was really worse and went to urgent care center. At the urgent care, his fever was 101.6. He was given some Zofran, but no lab work was done and he was discharged home this morning. When he came to the Emergency Room, he was noted to have abnormal laboratories and elevated LFTs. He was also noted to have been dehydrated. so hospitalized with pneumonia.   HOSPITAL COURSE AND STAY: He also had on presentation, cough and complicating sputums. He was also employed by a Therapist, art. His parents had some symptoms last week of flu and they were given Tamiflu. Initially after admission, he was started on community-acquired pneumonia antibiotics protocol, but later on due to his young age and more severe symptoms, he was started on antibiotics to go with aspiration pneumonia and community-acquired MRSA. He was started on vancomycin for better coverage. Pulmonary consult was done and they were agreeing with the plan and choice of antibiotics. After starting vancomycin, we noted that he has acute renal failure with creatinine rise and so the vancomycin was stopped and nephrologic consult was called in, but later on his pneumonia symptoms,  shortness of breath and other issues, the patient was feeling much better. He was without any supplemental oxygen and so he and his family requested that they wanted to leave against medical advice. He was advised to stay and get his renal function and other work-up done to rule out any other significant injury as a cause for his renal failure. Lab results on admission, influenza A and B negative. Troponin less than 0.02, TSH 2.16. Hemoglobin A1c 5.7. HIV test 1 and 2 negative. BUN 30, creatinine 0.82, sodium 135, potassium 4.1, chloride 101, CO2 27, alkaline phosphatase 69, SGPT 202 and SGOT 282. Repeat HIV test negative. Blood culture remains negative after 5 days. Urinalysis was grossly negative with 7 WBCs and negative leukocyte esterase.   CONDITION ON DISCHARGE: Guarded.   CODE STATUS: Full code.   The patient signed out against medical advise.   MEDICATIONS: Prescriptions were still given because he had pneumonia and he needed to be treated for that, so we gave the prescription of Linezolid 600 mg oral tablet 2 times a day for 4 days and levofloxacin 250 mg oral tablet every 48 hours for next 5 days.   FOLLOWUP: He was advised to follow up within 1 to 2 weeks with any doctor or clinic and check renal function Creatine should be followed with the next 5 to 6 days. He was advised to drink a lot of water daily at least 3 to 6 glasses and avoid all NSAIDs including Advil, Aleve, etc.   TOTAL TIME SPENT ON DISCHARGE: 45 minutes.    ____________________________ Ceasar Lund Anselm Jungling, MD  vgv:aw D: 01/07/2012 23:20:00 ET T: 01/08/2012 05:14:42 ET JOB#: [id number]  cc: Ceasar Lund. Anselm Jungling, MD, <Dictator> Vaughan Basta MD ELECTRONICALLY SIGNED 02/24/2012 15:06

## 2016-03-25 IMAGING — CR RIGHT HAND - COMPLETE 3+ VIEW
1 series · 3 of 3 positions shown · non-contrast
Comparison: None.

CLINICAL DATA: Fall, pain of the first metacarpal

EXAM:
RIGHT HAND - COMPLETE 3+ VIEW

[Series 1: dxr hand rt complete w/obliques · 0.14mm/px · 3 of 3 slices shown]
[im 1/3]
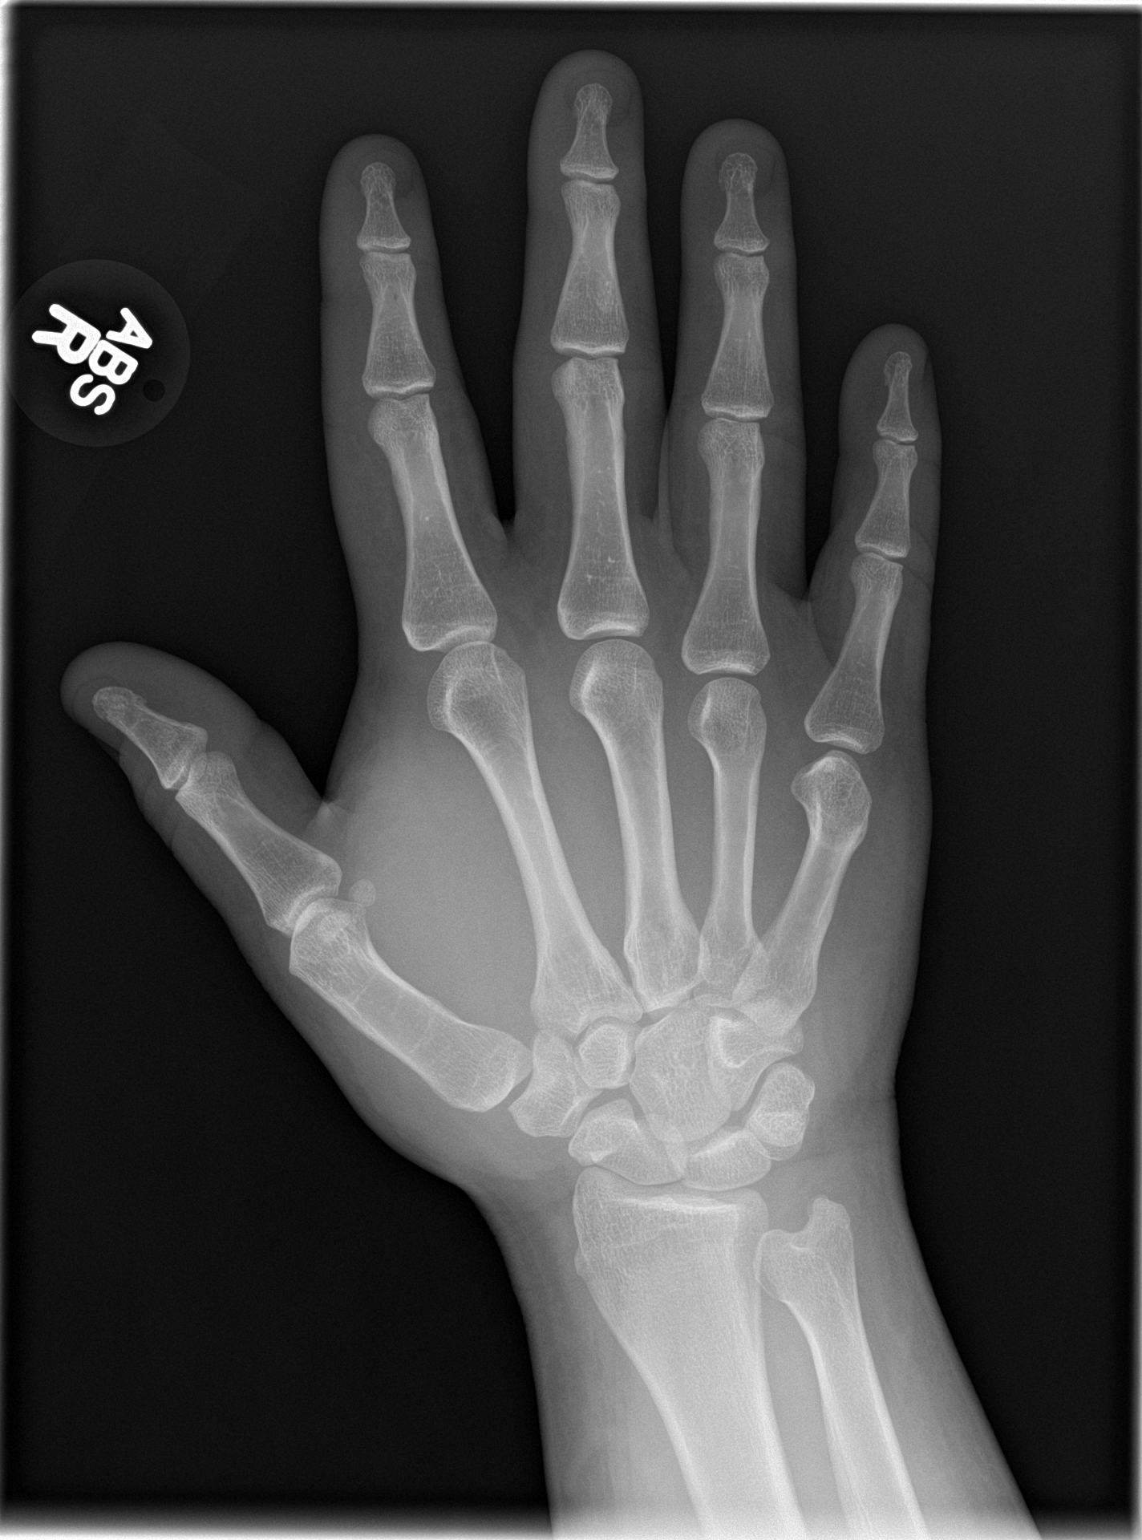
[im 2/3]
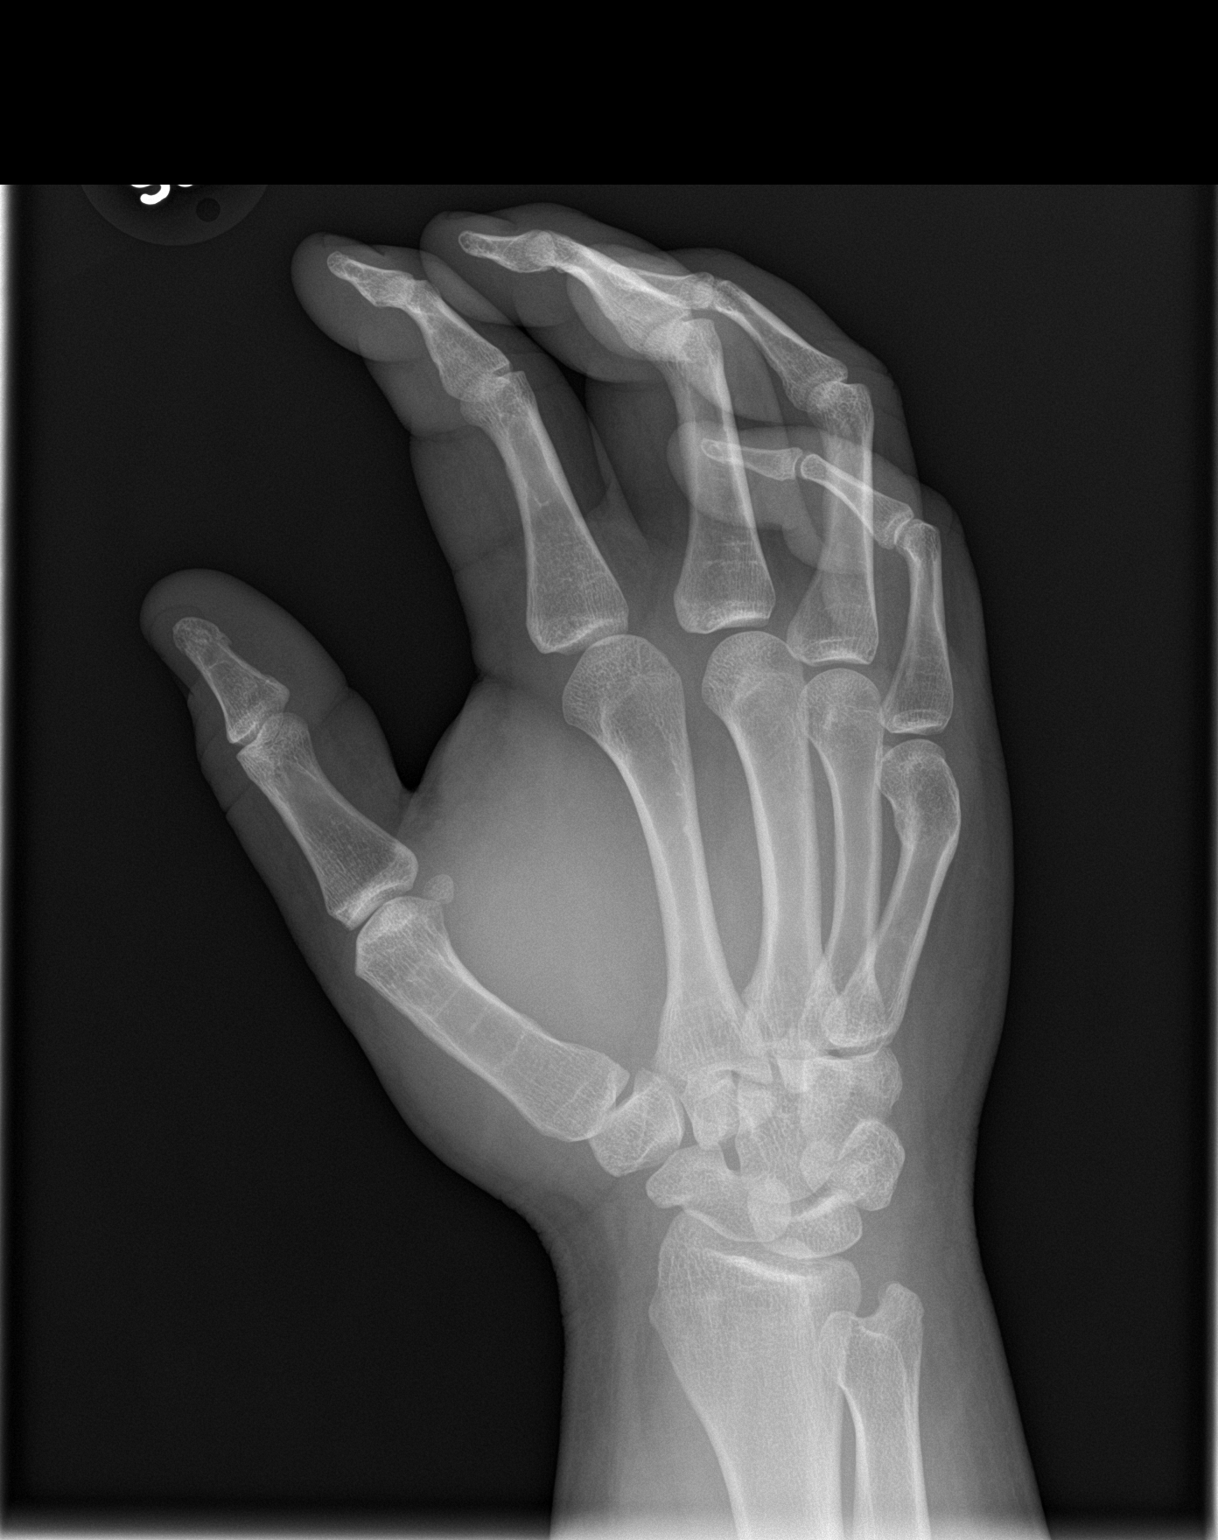
[im 3/3]
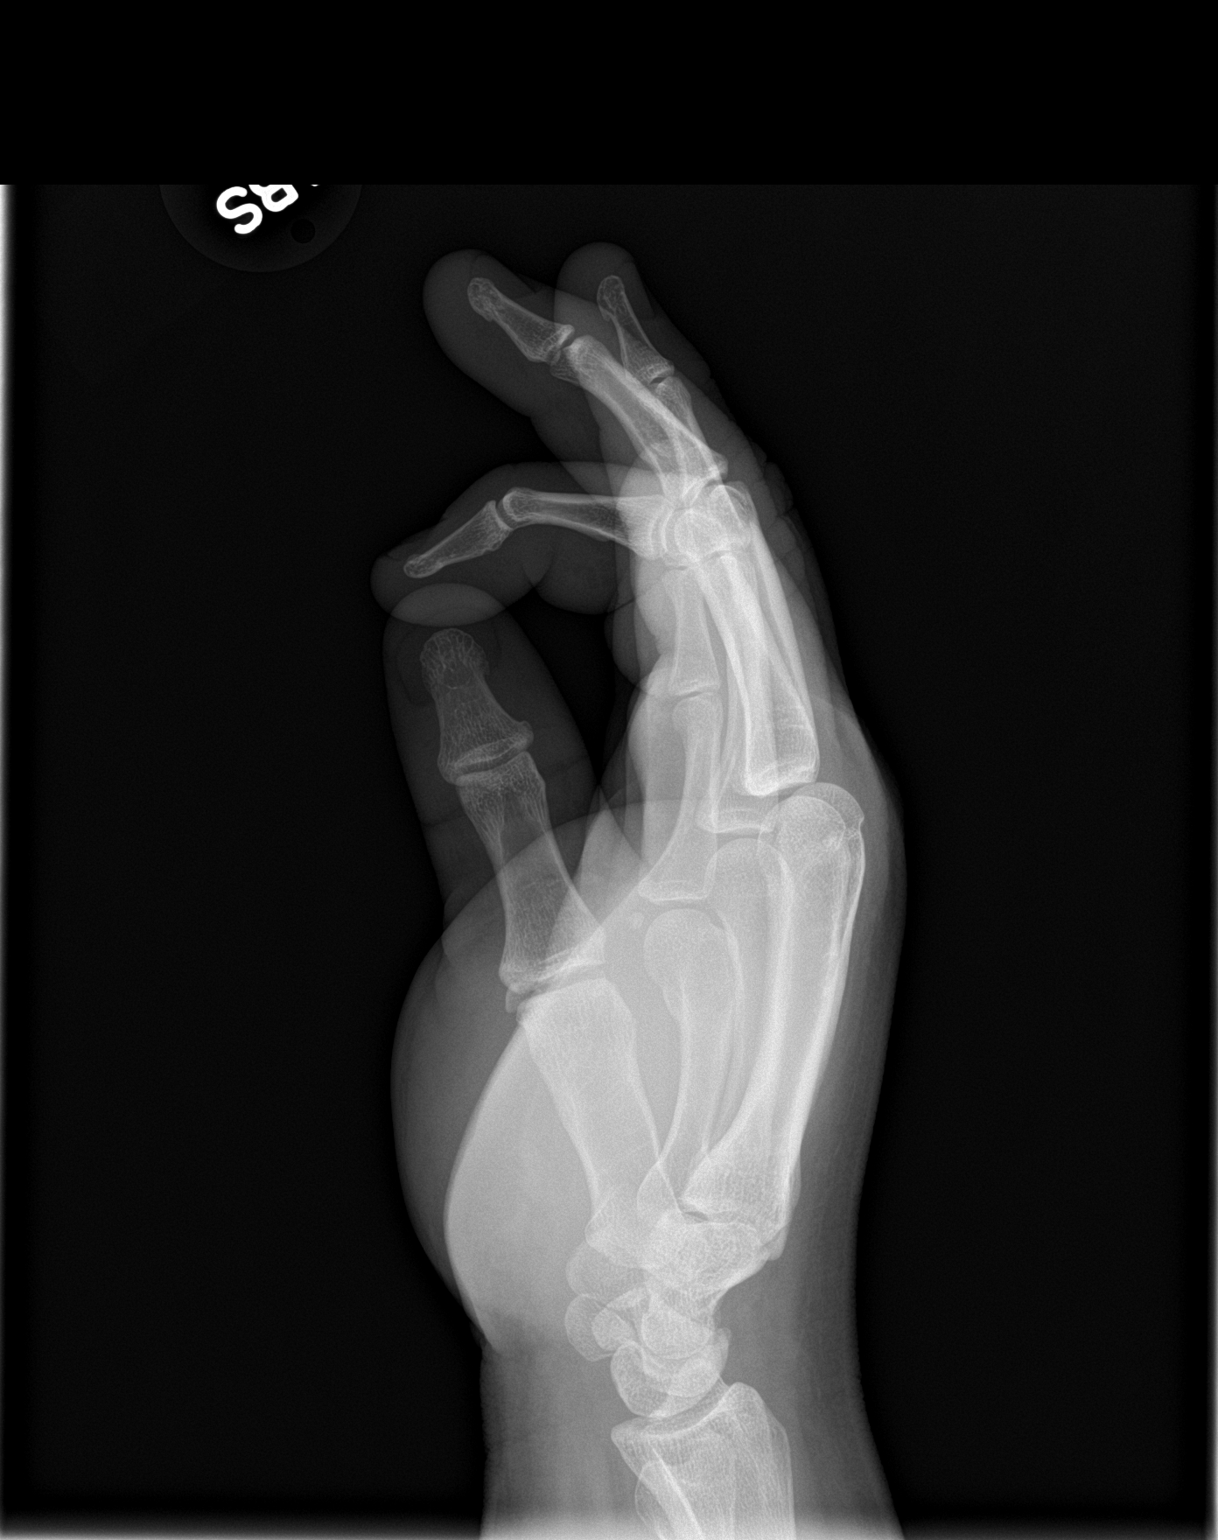

[3 of 3 positions shown; findings below may reference images not displayed]

FINDINGS: There is no evidence of fracture or dislocation. There is no
evidence of arthropathy or other focal bone abnormality. Soft
tissues are unremarkable. Evidence of remote right fifth metacarpal
fracture deformity is noted.
IMPRESSION: Negative.

## 2018-06-24 ENCOUNTER — Encounter: Payer: Self-pay | Admitting: Adult Health

## 2018-06-24 ENCOUNTER — Other Ambulatory Visit: Payer: Self-pay

## 2018-06-24 ENCOUNTER — Ambulatory Visit: Payer: No Typology Code available for payment source | Admitting: Adult Health

## 2018-06-24 VITALS — BP 136/89 | HR 77 | Resp 16 | Ht 71.0 in | Wt 354.0 lb

## 2018-06-24 DIAGNOSIS — L659 Nonscarring hair loss, unspecified: Secondary | ICD-10-CM | POA: Diagnosis not present

## 2018-06-24 NOTE — Progress Notes (Signed)
[facility] [place], [place] [address]  Internal MEDICINE  Office Visit Note  Patient Name: Rodney Collins  [id number]  [id number]  Date of Service: 06/24/2018   Complaints/HPI Pt is here for establishment of PCP. Chief Complaint  Patient presents with  . Alopecia    new patient about 3 weeks ago started notice hair coming out in strands, rapidly started coming out , up and down with weight , able to lose weight with diet restrictions, never had labs done .    HPI Pt is here to establish care. He reports that 3 weeks ago he began to notice his hair falling out rapidly. He reports he developed a bald spot quickly within a day or two.  He also reports his weigh has fluctuated abrasively.  He has not been to a doctor in some years, and has overall been healthy.   Current Medication: No outpatient encounter medications on file as of 06/24/2018.   No facility-administered encounter medications on file as of 06/24/2018.     Surgical History: Past Surgical History:  Procedure Laterality Date  . TUMOR REMOVAL Left    benign tumor removed from left arm     Medical History: Past Medical History:  Diagnosis Date  . Benign tumor     Family History: Family History  Problem Relation Age of Onset  . Lung cancer Maternal Grandfather     Social History   Socioeconomic History  . Marital status: Single    Spouse name: Not on file  . Number of children: Not on file  . Years of education: Not on file  . Highest education level: Not on file  Occupational History  . Not on file  Social Needs  . Financial resource strain: Not on file  . Food insecurity    Worry: Not on file    Inability: Not on file  . Transportation needs    Medical: Not on file    Non-medical: Not on file  Tobacco Use  . Smoking status: Never Smoker  . Smokeless tobacco: Never Used  Substance and Sexual Activity  . Alcohol use: Not Currently  . Drug use: Yes    Types:  Marijuana  . Sexual activity: Not on file  Lifestyle  . Physical activity    Days per week: Not on file    Minutes per session: Not on file  . Stress: Not on file  Relationships  . Social Herbalist on phone: Not on file    Gets together: Not on file    Attends religious service: Not on file    Active member of club or organization: Not on file    Attends meetings of clubs or organizations: Not on file    Relationship status: Not on file  . Intimate partner violence    Fear of current or ex partner: Not on file    Emotionally abused: Not on file    Physically abused: Not on file    Forced sexual activity: Not on file  Other Topics Concern  . Not on file  Social History Narrative  . Not on file     Review of Systems  Constitutional: Negative.  Negative for chills, fatigue and unexpected weight change.  HENT: Negative.  Negative for congestion, rhinorrhea, sneezing and sore throat.   Eyes: Negative for redness.  Respiratory: Negative.  Negative for cough, chest tightness and shortness of breath.   Cardiovascular: Negative.  Negative for chest pain and  palpitations.  Gastrointestinal: Negative.  Negative for abdominal pain, constipation, diarrhea, nausea and vomiting.  Endocrine: Negative.   Genitourinary: Negative.  Negative for dysuria and frequency.  Musculoskeletal: Negative.  Negative for arthralgias, back pain, joint swelling and neck pain.  Skin: Negative.  Negative for rash.  Allergic/Immunologic: Negative.   Neurological: Negative.  Negative for tremors and numbness.  Hematological: Negative for adenopathy. Does not bruise/bleed easily.  Psychiatric/Behavioral: Negative.  Negative for behavioral problems, sleep disturbance and suicidal ideas. The patient is not nervous/anxious.     Vital Signs: BP 136/89   Pulse 77   Resp 16   Ht 5\' 11"  (1.803 m)   Wt (!) 354 lb (160.6 kg)   SpO2 97%   BMI 49.37 kg/m    Physical Exam Vitals signs and nursing  note reviewed.  Constitutional:      General: He is not in acute distress.    Appearance: He is well-developed. He is not diaphoretic.  HENT:     Head: Normocephalic and atraumatic.     Mouth/Throat:     Pharynx: No oropharyngeal exudate.  Eyes:     Pupils: Pupils are equal, round, and reactive to light.  Neck:     Musculoskeletal: Normal range of motion and neck supple.     Thyroid: No thyromegaly.     Vascular: No JVD.     Trachea: No tracheal deviation.  Cardiovascular:     Rate and Rhythm: Normal rate and regular rhythm.     Heart sounds: Normal heart sounds. No murmur. No friction rub. No gallop.   Pulmonary:     Effort: Pulmonary effort is normal. No respiratory distress.     Breath sounds: Normal breath sounds. No wheezing or rales.  Chest:     Chest wall: No tenderness.  Abdominal:     Palpations: Abdomen is soft.     Tenderness: There is no abdominal tenderness. There is no guarding.  Musculoskeletal: Normal range of motion.  Lymphadenopathy:     Cervical: No cervical adenopathy.  Skin:    General: Skin is warm and dry.  Neurological:     Mental Status: He is alert and oriented to person, place, and time.     Cranial Nerves: No cranial nerve deficit.  Psychiatric:        Behavior: Behavior normal.        Thought Content: Thought content normal.        Judgment: Judgment normal.     Assessment/Plan: 1. Hair loss Gave patient lab slip for thyoid labs as well as general physical labs, he will follow up in 2 weeks for physical.  2. Morbid obesity ([place]) Obesity Counseling: Risk Assessment: An assessment of behavioral risk factors was made today and includes lack of exercise sedentary lifestyle, lack of portion control and poor dietary habits.  Risk Modification Advice: She was counseled on portion control guidelines. Restricting daily caloric intake to. . The detrimental long term effects of obesity on her health and ongoing poor compliance was also discussed with  the patient.    General Counseling: keny donald understanding of the findings of todays visit and agrees with plan of treatment. I have discussed any further diagnostic evaluation that may be needed or ordered today. We also reviewed his medications today. he has been encouraged to call the office with any questions or concerns that should arise related to todays visit.  No orders of the defined types were placed in this encounter.   No orders of the defined  types were placed in this encounter.   Time spent: 30 Minutes   This patient was seen by Orson Gear AGNP-C in Collaboration with Dr Lavera Guise as a part of collaborative care agreement  Kendell Bane AGNP-C Internal Medicine

## 2018-06-29 ENCOUNTER — Encounter: Payer: Self-pay | Admitting: Adult Health

## 2018-07-08 ENCOUNTER — Encounter: Payer: No Typology Code available for payment source | Admitting: Adult Health

## 2022-07-11 ENCOUNTER — Telehealth: Payer: Self-pay | Admitting: Family Medicine

## 2022-07-11 DIAGNOSIS — M7989 Other specified soft tissue disorders: Secondary | ICD-10-CM

## 2022-07-11 NOTE — Progress Notes (Signed)
Virtual Visit Consent   Rodney Collins, you are scheduled for a virtual visit with a [facility] provider today. Just as with appointments in the office, your consent must be obtained to participate. Your consent will be active for this visit and any virtual visit you may have with one of our providers in the next 365 days. If you have a MyChart account, a copy of this consent can be sent to you electronically.  As this is a virtual visit, video technology does not allow for your provider to perform a traditional examination. This may limit your provider's ability to fully assess your condition. If your provider identifies any concerns that need to be evaluated in person or the need to arrange testing (such as labs, EKG, etc.), we will make arrangements to do so. Although advances in technology are sophisticated, we cannot ensure that it will always work on either your end or our end. If the connection with a video visit is poor, the visit may have to be switched to a telephone visit. With either a video or telephone visit, we are not always able to ensure that we have a secure connection.  By engaging in this virtual visit, you consent to the provision of healthcare and authorize for your insurance to be billed (if applicable) for the services provided during this visit. Depending on your insurance coverage, you may receive a charge related to this service.  I need to obtain your verbal consent now. Are you willing to proceed with your visit today? Rodney Collins has provided verbal consent on 07/11/2022 for a virtual visit (video or telephone). Freddy Finner, NP  Date: 07/11/2022 9:16 AM  Virtual Visit via Video Note   I, Freddy Finner, connected with  Rodney Collins  ([id number], Apr 11, 1990) on 07/11/22 at  9:15 AM EDT by a video-enabled telemedicine application and verified that I am speaking with the correct person using two identifiers.  Location: Patient: Virtual Visit Location Patient:  Home Provider: Virtual Visit Location Provider: Home Office   I discussed the limitations of evaluation and management by telemedicine and the availability of in person appointments. The patient expressed understanding and agreed to proceed.    History of Present Illness: Rodney Collins is a 31 y.o. who identifies as a male who was assigned male at birth, and is being seen today for leg swelling and warmth.  See Epic for history of cellulitis yearly last 2 years. Needs in person assessment and eval given history and rule out DVT.    HPI: HPI  Problems:  Patient Active Problem List   Diagnosis Date Noted   Distal radius fracture 12/24/2012    Allergies: No Known Allergies Medications: No current outpatient medications on file.  Observations/Objective: Patient is well-developed, well-nourished in no acute distress.  Resting comfortably  at home.  Head is normocephalic, atraumatic.  No labored breathing.  Speech is clear and coherent with logical content.  Patient is alert and oriented at baseline.    Assessment and Plan:  1. Leg swelling  He needs to be seen in person today. Patient acknowledged agreement and understanding of the plan.     Follow Up Instructions: I discussed the assessment and treatment plan with the patient. The patient was provided an opportunity to ask questions and all were answered. The patient agreed with the plan and demonstrated an understanding of the instructions.  A copy of instructions were sent to the patient via MyChart unless otherwise noted below.  The patient was advised to call back or seek an in-person evaluation if the symptoms worsen or if the condition fails to improve as anticipated.  Time:  I spent 5 minutes with the patient via telehealth technology discussing the above problems/concerns.    Freddy Finner, NP
